# Patient Record
Sex: Female | Born: 1970 | Race: White | Hispanic: No | Marital: Married | Smoking: Never smoker
Health system: Southern US, Community
[De-identification: ages and names within clinical notes are randomized; demographics above are authoritative.]

## PROBLEM LIST (undated history)

## (undated) DIAGNOSIS — Z9889 Other specified postprocedural states: Secondary | ICD-10-CM

## (undated) DIAGNOSIS — T7840XA Allergy, unspecified, initial encounter: Secondary | ICD-10-CM

## (undated) DIAGNOSIS — R112 Nausea with vomiting, unspecified: Secondary | ICD-10-CM

## (undated) DIAGNOSIS — E785 Hyperlipidemia, unspecified: Secondary | ICD-10-CM

## (undated) HISTORY — DX: Allergy, unspecified, initial encounter: T78.40XA

## (undated) HISTORY — DX: Hyperlipidemia, unspecified: E78.5

## (undated) HISTORY — DX: Nausea with vomiting, unspecified: Z98.890

## (undated) HISTORY — DX: Other specified postprocedural states: Z98.890

## (undated) HISTORY — DX: Nausea with vomiting, unspecified: R11.2

## (undated) HISTORY — PX: ANKLE SURGERY: SHX546

---

## 1997-12-09 ENCOUNTER — Inpatient Hospital Stay (HOSPITAL_COMMUNITY): Admission: AD | Admit: 1997-12-09 | Discharge: 1997-12-10 | Payer: Self-pay | Admitting: Obstetrics and Gynecology

## 1998-04-20 ENCOUNTER — Inpatient Hospital Stay (HOSPITAL_COMMUNITY): Admission: AD | Admit: 1998-04-20 | Discharge: 1998-04-20 | Payer: Self-pay | Admitting: Obstetrics and Gynecology

## 1998-04-26 ENCOUNTER — Inpatient Hospital Stay (HOSPITAL_COMMUNITY): Admission: AD | Admit: 1998-04-26 | Discharge: 1998-04-29 | Payer: Self-pay | Admitting: Obstetrics and Gynecology

## 1998-04-26 ENCOUNTER — Inpatient Hospital Stay (HOSPITAL_COMMUNITY): Admission: AD | Admit: 1998-04-26 | Discharge: 1998-04-26 | Payer: Self-pay | Admitting: Obstetrics & Gynecology

## 1998-06-06 ENCOUNTER — Other Ambulatory Visit: Admission: RE | Admit: 1998-06-06 | Discharge: 1998-06-06 | Payer: Self-pay | Admitting: *Deleted

## 1999-07-01 ENCOUNTER — Other Ambulatory Visit: Admission: RE | Admit: 1999-07-01 | Discharge: 1999-07-01 | Payer: Self-pay | Admitting: *Deleted

## 2003-01-04 ENCOUNTER — Other Ambulatory Visit: Admission: RE | Admit: 2003-01-04 | Discharge: 2003-01-04 | Payer: Self-pay | Admitting: *Deleted

## 2004-11-20 ENCOUNTER — Other Ambulatory Visit: Admission: RE | Admit: 2004-11-20 | Discharge: 2004-11-20 | Payer: Self-pay | Admitting: Obstetrics and Gynecology

## 2006-01-18 ENCOUNTER — Other Ambulatory Visit: Admission: RE | Admit: 2006-01-18 | Discharge: 2006-01-18 | Payer: Self-pay | Admitting: Obstetrics and Gynecology

## 2006-02-05 ENCOUNTER — Ambulatory Visit: Payer: Self-pay | Admitting: Cardiology

## 2006-02-08 ENCOUNTER — Ambulatory Visit: Payer: Self-pay | Admitting: Cardiology

## 2006-02-25 ENCOUNTER — Ambulatory Visit: Payer: Self-pay

## 2006-02-25 ENCOUNTER — Encounter: Payer: Self-pay | Admitting: Cardiology

## 2006-03-23 ENCOUNTER — Ambulatory Visit: Payer: Self-pay | Admitting: Cardiology

## 2006-11-11 ENCOUNTER — Ambulatory Visit: Payer: Self-pay

## 2006-11-11 LAB — CONVERTED CEMR LAB: Total CK: 43 units/L (ref 7–177)

## 2008-02-17 ENCOUNTER — Emergency Department (HOSPITAL_COMMUNITY): Admission: EM | Admit: 2008-02-17 | Discharge: 2008-02-18 | Payer: Self-pay | Admitting: Emergency Medicine

## 2009-11-20 IMAGING — CT CT PELVIS W/ CM
2 of 8 series · 13 of 46 positions shown, 18 images · IV contrast (APPLIED)
Comparison: None.

CT ABDOMEN

CLINICAL DATA: Severe abdominal and pelvic pain.

CT ABDOMEN AND PELVIS WITH CONTRAST  02/18/2008:
TECHNIQUE: Multidetector CT imaging of the abdomen and pelvis was
performed using the standard protocol following bolus
administration of intravenous contrast. Because of findings
discussed below, repeat imaging through the pelvis was performed
approximately 2 hours after the initial imaging.
Contrast: 125 ml Tmnipaque-5KK IV.  Oral contrast also given.

[Series 2: abd_pel 5.0 b40f st · axial · 0.58mm/px · z∈[-460,-60]mm · 10 of 94 slices shown, 15 images]
[im 7/94  soft-tissue]
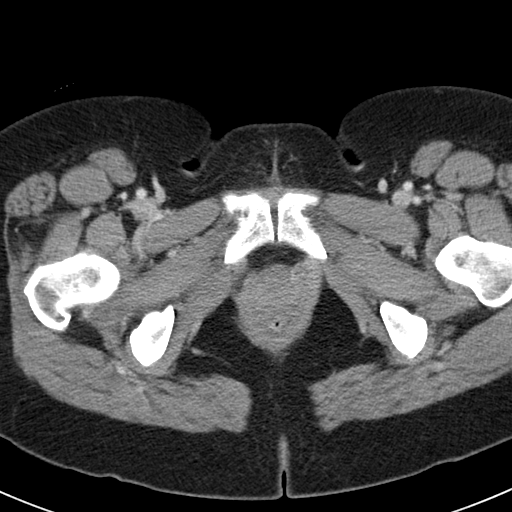
[im 7/94  bone]
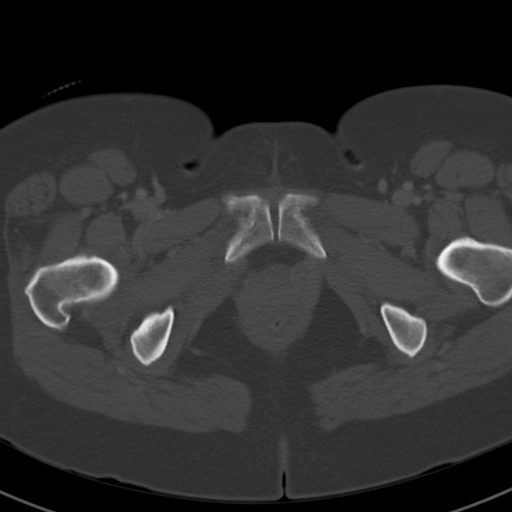
[im 19/94  soft-tissue]
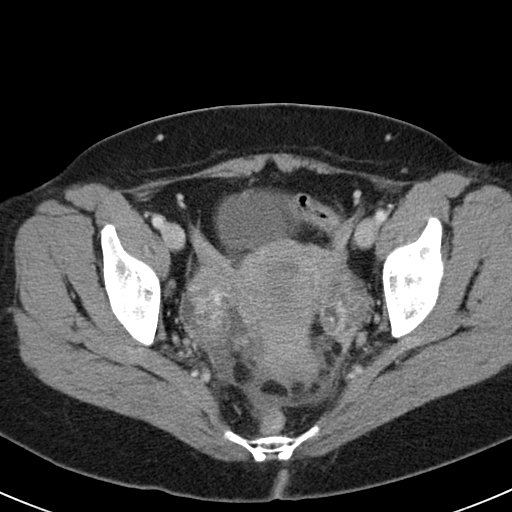
[im 25/94  soft-tissue]
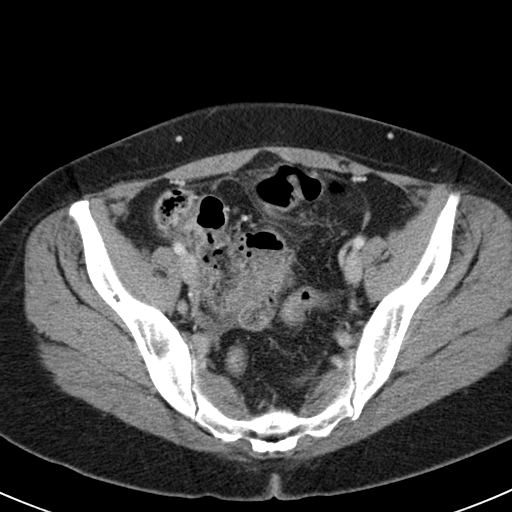
[im 38/94  soft-tissue]
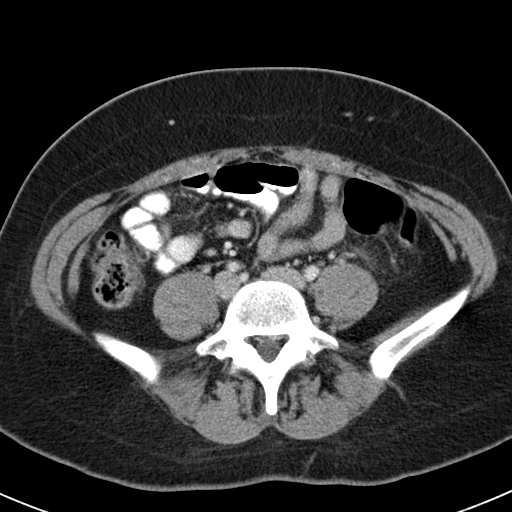
[im 50/94  soft-tissue]
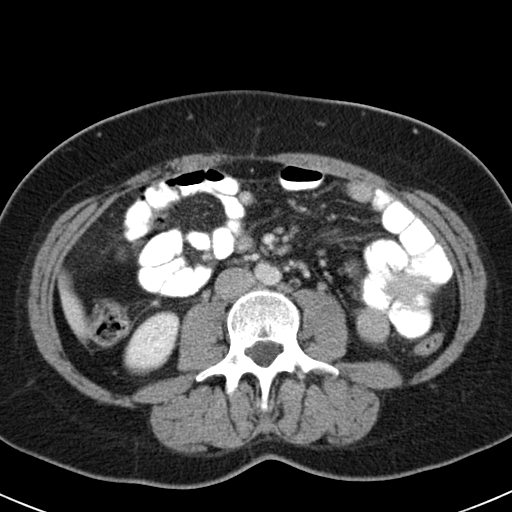
[im 56/94  soft-tissue]
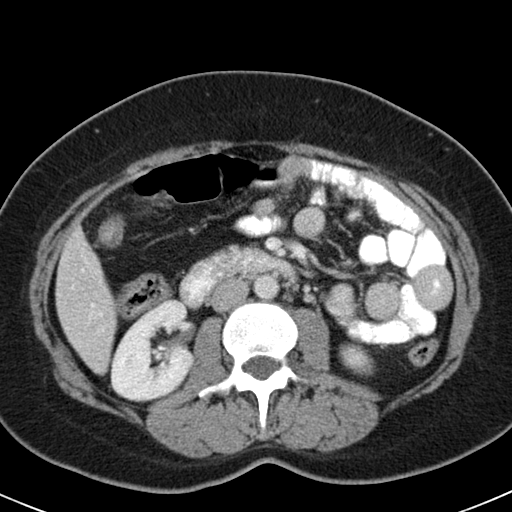
[im 69/94  soft-tissue]
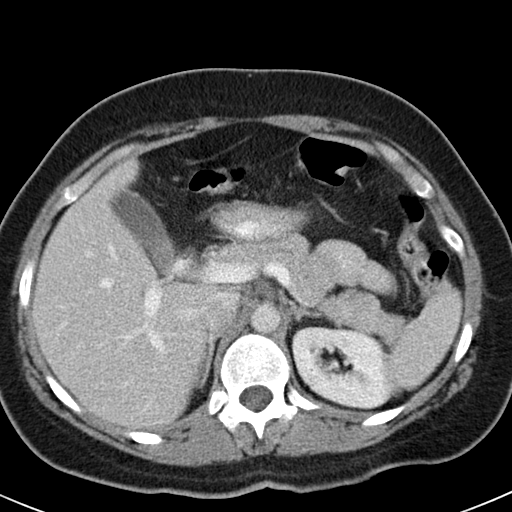
[im 69/94  lung]
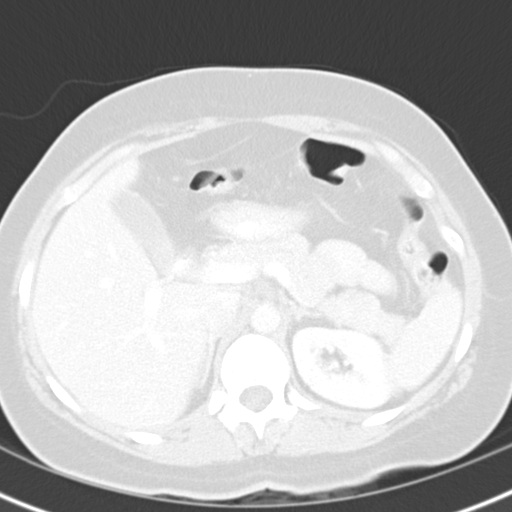
[im 75/94  soft-tissue]
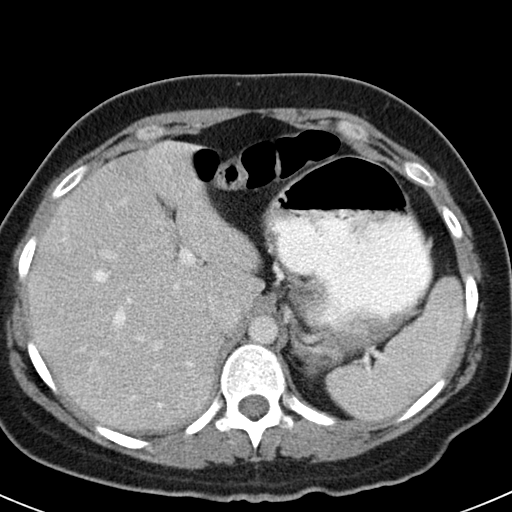
[im 75/94  lung]
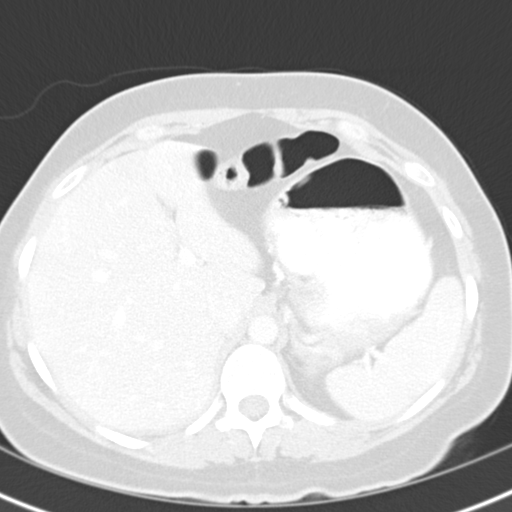
[im 81/94  lung]
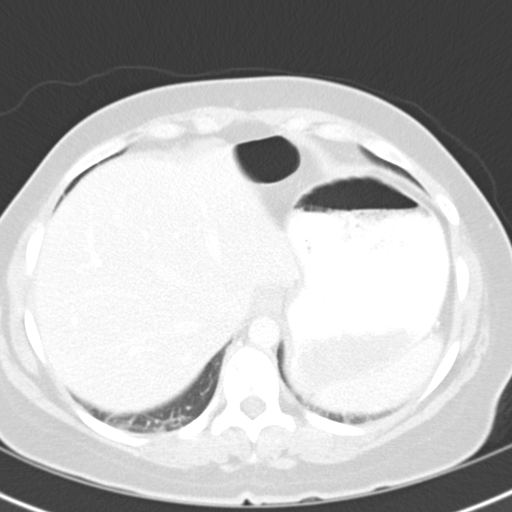
[im 87/94  soft-tissue]
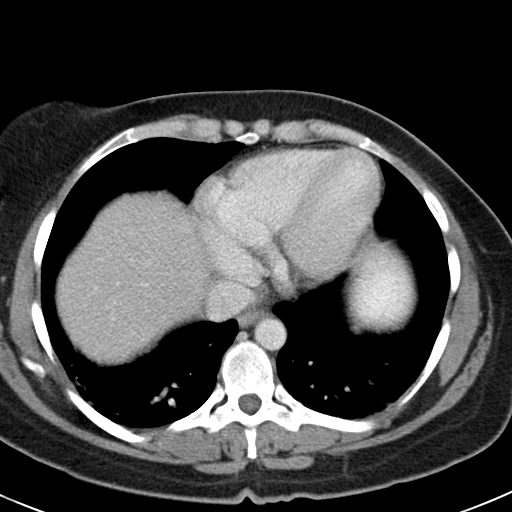
[im 87/94  lung]
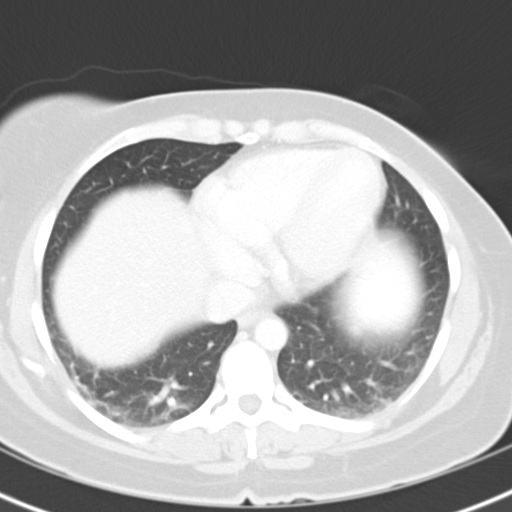
[im 87/94  bone]
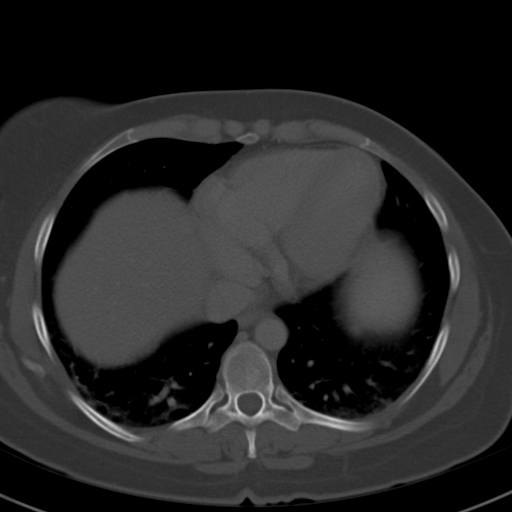

[Series 602: coronal abdomen · coronal · 0.94mm/px · 3 of 111 slices shown]
[im 28/111  soft-tissue]
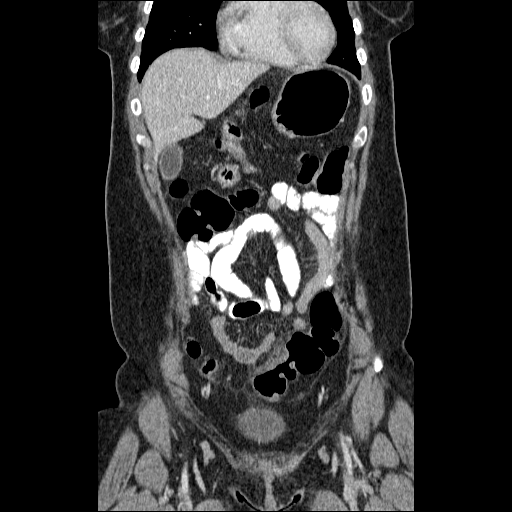
[im 56/111  soft-tissue]
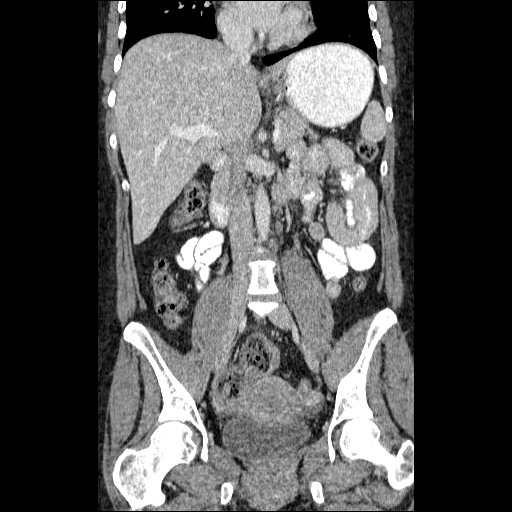
[im 83/111  soft-tissue]
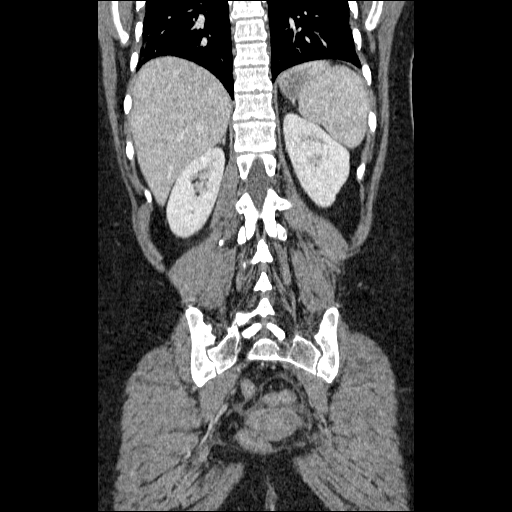

[13 of 46 positions shown; findings below may reference images not displayed]

FINDINGS: Normal appearing liver, spleen, pancreas, adrenal
glands, and kidneys.  Gallbladder unremarkable by CT.  No biliary
ductal dilation.  Stomach and visualized small bowel and colon
unremarkable in the upper abdomen.  Apparent wall thickening
involving a few loops of small bowel I believe is related to
peristalsis rather than true wall thickening. Abdominal aorta
normal in appearance.  Patent visceral arteries.  No significant
lymphadenopathy.  No ascites.  Visualized lung bases clear apart
from the expected dependent atelectasis in the lower lobes.  Bone
window images unremarkable.
IMPRESSION: 1.  Normal CT of the abdomen.

CT PELVIS
FINDINGS: Mild wall thickening involving the sigmoid colon, with
pericolonic inflammation surrounding this segment.  Delayed images
confirm a normal appearance to the cecum which extends into the low
pelvis on top of the bladder..  Normal appearing short appendix
present in the midline of the lower pelvis. Urinary bladder
unremarkable.  Uterus unremarkable by CT.  Numerous bilateral
ovarian cysts, some with enhancing walls consistent with functional
cysts; no dominant ovarian cyst.  Small amount of free fluid
dependently in the pelvis.  No abscess.  No significant
lymphadenopathy.  Bone window images unremarkable.
IMPRESSION: 1.  Sigmoid diverticulitis versus focal sigmoid colitis.
2.  Small amount of pelvic ascites.
3.  Bilateral ovarian cysts.

## 2011-07-21 LAB — CBC
MCHC: 34.6
MCV: 87.8
Platelets: 320
RDW: 13.1

## 2011-07-21 LAB — COMPREHENSIVE METABOLIC PANEL
AST: 19
Albumin: 3.7
Calcium: 8.7
Creatinine, Ser: 1.26 — ABNORMAL HIGH
GFR calc Af Amer: 58 — ABNORMAL LOW
GFR calc non Af Amer: 48 — ABNORMAL LOW
Total Protein: 7

## 2011-07-21 LAB — DIFFERENTIAL
Eosinophils Relative: 1
Lymphocytes Relative: 24
Lymphs Abs: 2.9
Monocytes Absolute: 1.3 — ABNORMAL HIGH
Monocytes Relative: 10

## 2011-07-21 LAB — PREGNANCY, URINE: Preg Test, Ur: NEGATIVE

## 2011-07-21 LAB — URINALYSIS, ROUTINE W REFLEX MICROSCOPIC
Bilirubin Urine: NEGATIVE
Hgb urine dipstick: NEGATIVE
Protein, ur: NEGATIVE
Urobilinogen, UA: 0.2

## 2011-07-21 LAB — POCT CARDIAC MARKERS
Operator id: 4531
Troponin i, poc: <0.05

## 2012-06-21 ENCOUNTER — Ambulatory Visit
Admission: RE | Admit: 2012-06-21 | Discharge: 2012-06-21 | Disposition: A | Discharge status: Home / Self Care | Payer: BC Managed Care – PPO | Source: Ambulatory Visit | Attending: Family Medicine | Admitting: Family Medicine

## 2012-06-21 ENCOUNTER — Other Ambulatory Visit: Payer: Self-pay | Admitting: Family Medicine

## 2012-06-21 DIAGNOSIS — Z0000 Encounter for general adult medical examination without abnormal findings: Secondary | ICD-10-CM

## 2014-03-24 IMAGING — CR DG CHEST 2V
2 series · 2 of 2 positions shown · non-contrast
Comparison: None

CLINICAL DATA: Routine physical examination.

CHEST - 2 VIEW

[view not recorded (1 of 2)]
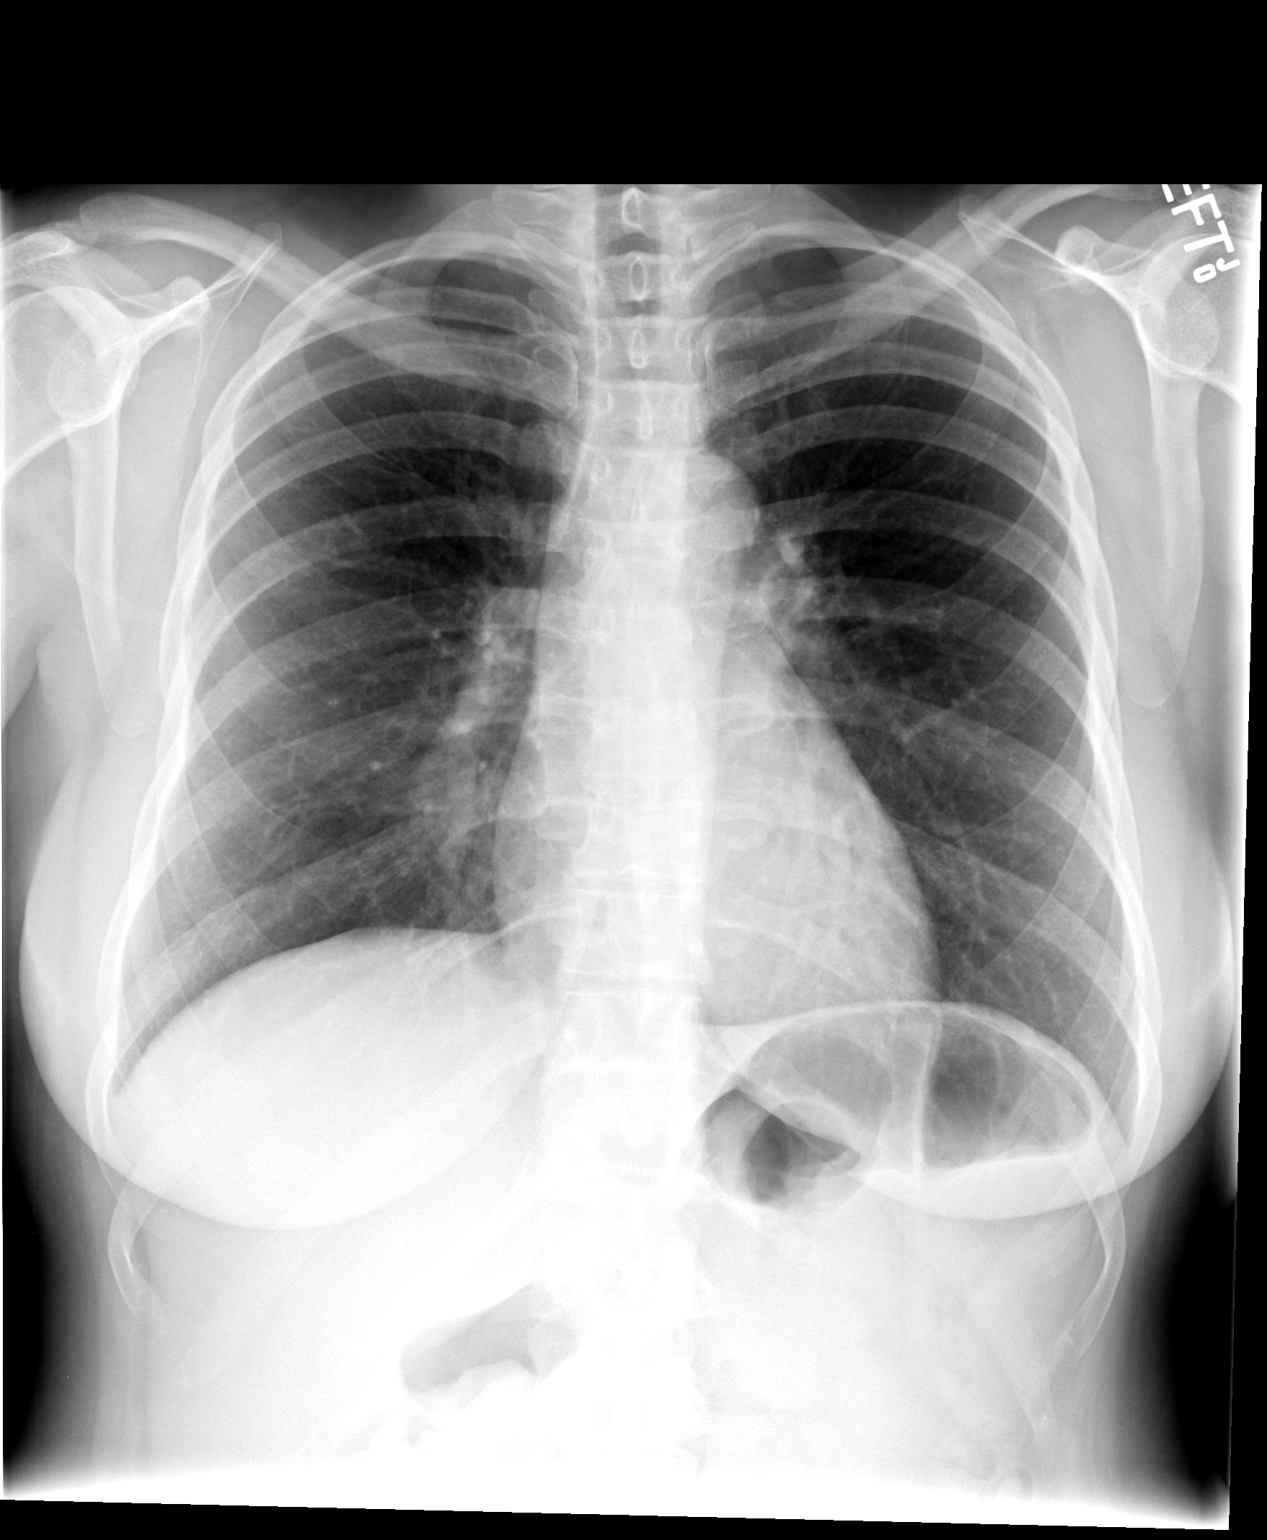

[view not recorded (2 of 2)]
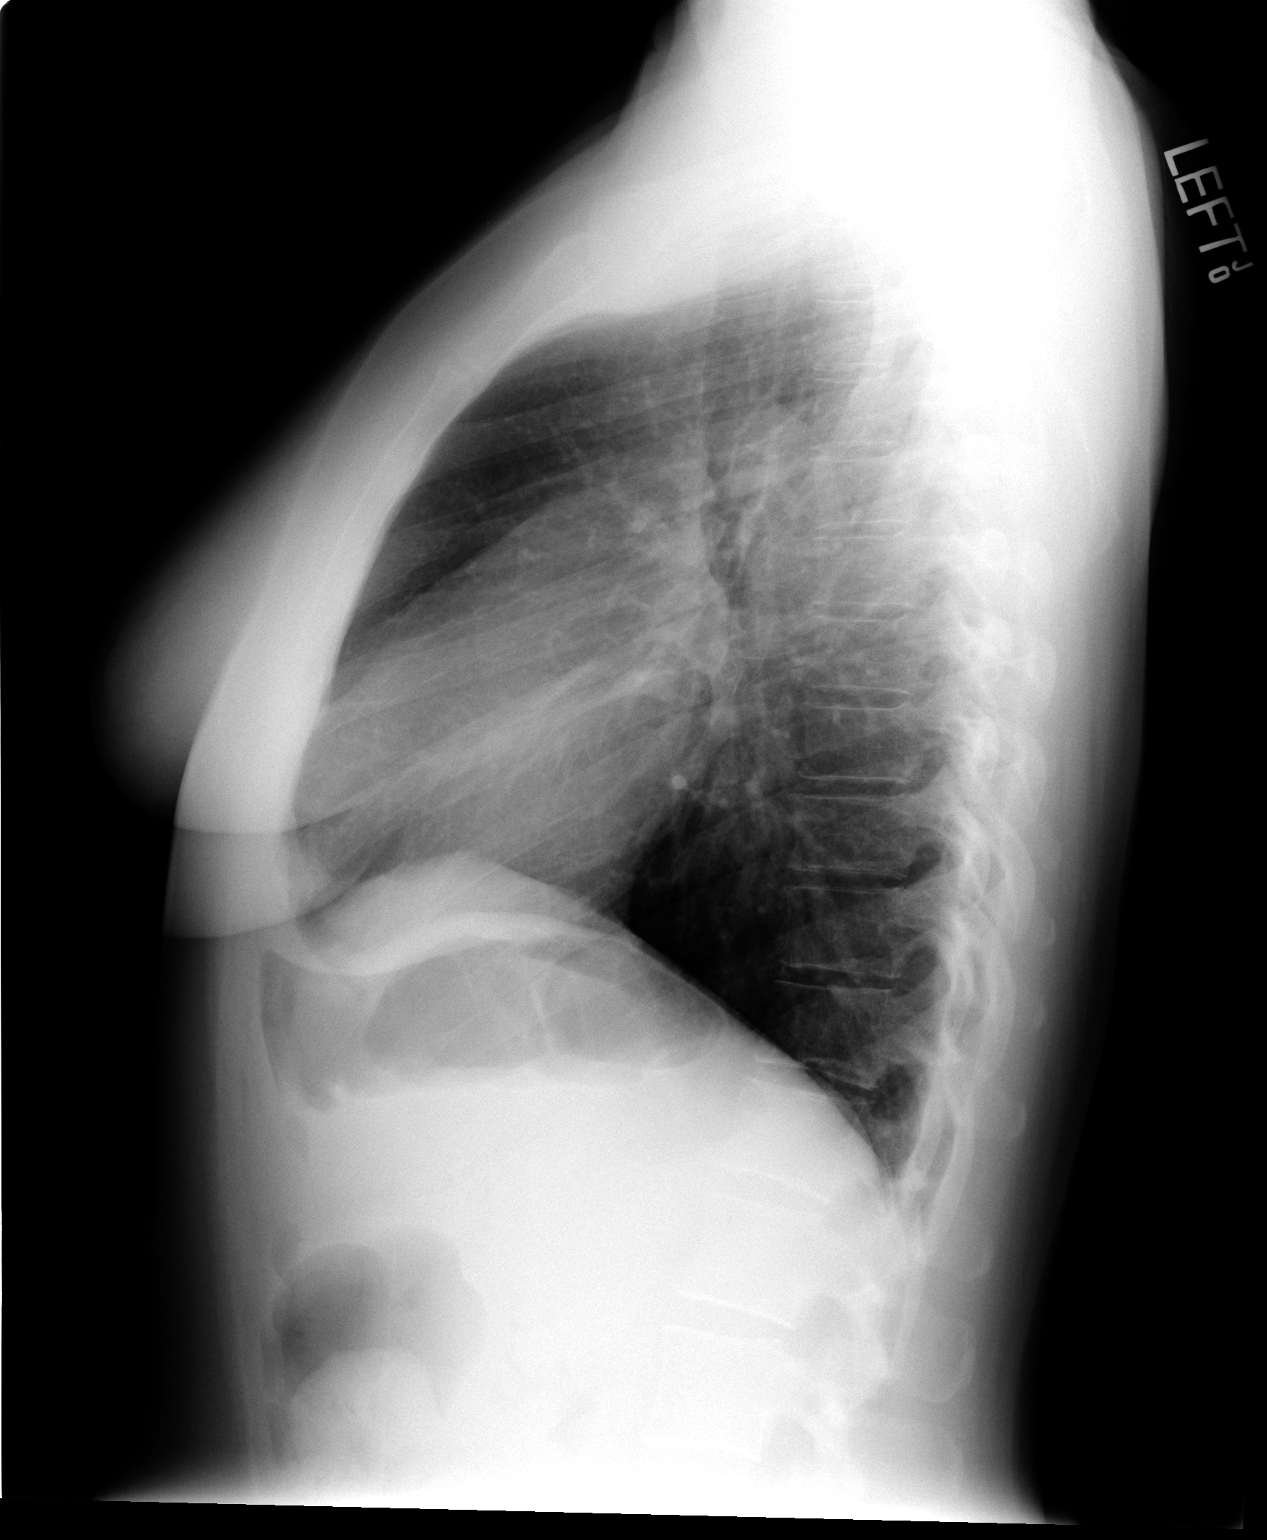

[2 of 2 positions shown; findings below may reference images not displayed]

FINDINGS: The cardiac silhouette, mediastinal and hilar contours
are within normal limits.  The lungs are clear.  No pleural
effusion.  The bony thorax is intact.
IMPRESSION: No acute cardiopulmonary findings.

## 2019-01-20 NOTE — Pre-Procedure Instructions (Addendum)
Shari Dalton  01/20/2019      [id number] - [place], [place] - [address] [place] [address] Phone: [phone] Fax: [phone]    Your procedure is scheduled on 01/30/19 Monday from 0730-0923am  Report to [facility], Entrance A at 0530 A.M.  Call this number if you have problems the morning of surgery:  [phone]   Remember:  Do not eat or drink after midnight.     Take these medicines the morning of surgery with A SIP OF WATER :none   As of today, STOP taking any Aspirin (unless otherwise instructed by your surgeon), Aleve, Naproxen, Ibuprofen, Motrin, Advil, Goody's, BC's, all herbal medications, fish oil, and all vitamins.   Do not wear jewelry, make-up or nail polish.  Do not wear lotions, powders, or perfumes, or deodorant.  Do not shave 48 hours prior to surgery.  Men may shave face and neck.  Do not bring valuables to the hospital.  [facility] is not responsible for any belongings or valuables.  Contacts, dentures or bridgework may not be worn into surgery.  Leave your suitcase in the car.  After surgery it may be brought to your room.  For patients admitted to the hospital, discharge time will be determined by your treatment team.  Patients discharged the day of surgery will not be allowed to drive home.   Special instructions:  [place]- Preparing For Surgery  Before surgery, you can play an important role. Because skin is not sterile, your skin needs to be as free of germs as possible. You can reduce the number of germs on your skin by washing with CHG (chlorahexidine gluconate) Soap before surgery.  CHG is an antiseptic cleaner which kills germs and bonds with the skin to continue killing germs even after washing.    Oral Hygiene is also important to reduce your risk of infection.  Remember - BRUSH YOUR TEETH THE MORNING OF SURGERY WITH YOUR REGULAR  TOOTHPASTE  Please do not use if you have an allergy to CHG or antibacterial soaps. If your skin becomes reddened/irritated stop using the CHG.  Do not shave (including legs and underarms) for at least 48 hours prior to first CHG shower. It is OK to shave your face.  Please follow these instructions carefully.   1. Shower the NIGHT BEFORE SURGERY and the MORNING OF SURGERY with CHG.   2. If you chose to wash your hair, wash your hair first as usual with your normal shampoo.  3. After you shampoo, rinse your hair and body thoroughly to remove the shampoo.  4. Use CHG as you would any other liquid soap. You can apply CHG directly to the skin and wash gently with a scrungie or a clean washcloth.   5. Apply the CHG Soap to your body ONLY FROM THE NECK DOWN.  Do not use on open wounds or open sores. Avoid contact with your eyes, ears, mouth and genitals (private parts). Wash Face and genitals (private parts)  with your normal soap.  6. Wash thoroughly, paying special attention to the area where your surgery will be performed.  7. Thoroughly rinse your body with warm water from the neck down.  8. DO NOT shower/wash with your normal soap after using and rinsing off the CHG Soap.  9. Pat yourself dry with a CLEAN TOWEL.  10. Wear CLEAN PAJAMAS to bed the night before surgery, wear comfortable  clothes the morning of surgery  11. Place CLEAN SHEETS on your bed the night of your first shower and DO NOT SLEEP WITH PETS.  Day of Surgery:  Do not apply any deodorants/lotions.  Please wear clean clothes to the hospital/surgery center.   Remember to brush your teeth WITH YOUR REGULAR TOOTHPASTE.  Please read over the following fact sheets that you were given. Pain Booklet, Coughing and Deep Breathing and Surgical Site Infection Prevention

## 2019-01-23 ENCOUNTER — Encounter (HOSPITAL_COMMUNITY): Payer: Self-pay

## 2019-01-23 ENCOUNTER — Encounter (HOSPITAL_COMMUNITY)
Admission: RE | Admit: 2019-01-23 | Discharge: 2019-01-23 | Disposition: A | Discharge status: Home / Self Care | Payer: Commercial Managed Care - PPO | Source: Ambulatory Visit | Attending: Obstetrics and Gynecology | Admitting: Obstetrics and Gynecology

## 2019-01-23 ENCOUNTER — Other Ambulatory Visit: Payer: Self-pay

## 2019-01-23 DIAGNOSIS — Z01812 Encounter for preprocedural laboratory examination: Secondary | ICD-10-CM | POA: Insufficient documentation

## 2019-01-23 LAB — CBC
HEMATOCRIT: 42.7 % (ref 36.0–46.0)
HEMOGLOBIN: 14 g/dL (ref 12.0–15.0)
MCH: 30 pg (ref 26.0–34.0)
MCHC: 32.8 g/dL (ref 30.0–36.0)
MCV: 91.6 fL (ref 80.0–100.0)
Platelets: 293 10*3/uL (ref 150–400)
RBC: 4.66 MIL/uL (ref 3.87–5.11)
RDW: 13 % (ref 11.5–15.5)
WBC: 5.3 10*3/uL (ref 4.0–10.5)
nRBC: 0 % (ref 0.0–0.2)

## 2019-01-23 NOTE — Progress Notes (Signed)
PCP - Dr. Dian Queen Cardiologist - has seen [facility] 10+ years ago for high cholesterol. No cardiac issues  Chest x-ray - N/A EKG - N/A Stress Test - denies ECHO - denies Cardiac Cath - denies  Sleep Study - denies  Aspirin Instructions: Patient instructed to hold all Aspirin, NSAID's, herbal medications, fish oil and vitamins 7 days prior to surgery.   Anesthesia review:   Patient denies shortness of breath, fever, cough and chest pain at PAT appointment   Patient verbalized understanding of instructions that were given to them at the PAT appointment. Patient was also instructed that they will need to review over the PAT instructions again at home before surgery.

## 2019-01-25 NOTE — H&P (Signed)
  48 year old G 3 P 2 with heavy, irregular bleeding despite IUD and Craumping. Ultrasound enlarged myomatous uterus about 12 week  PMH Unremarkable  PSH  None  Prior to Admission medications   Medication Sig Start Date End Date Taking? Authorizing Provider  ibuprofen (ADVIL,MOTRIN) 200 MG tablet Take 400-600 mg by mouth every 8 (eight) hours as needed (pain.).   Yes [provider]   Latex and Neosporin [neomycin-bacitracin zn-polymyx]  No family history on file.  General alert and oriented Lung CTAB Car RRR Abdomen soft and non tender Pelvic Broad based enlarged myomatous uterus   IMPRESSION Symptomatic Fibroids Menorrhagia  PLAN: TAH BS Risks reviewed Consent signed

## 2019-01-27 MED ORDER — CEFOTETAN DISODIUM-DEXTROSE 2-2.08 GM-%(50ML) IV SOLR
2.0000 g | INTRAVENOUS | Status: AC
  Administered 2019-01-30: 2 g via INTRAVENOUS
  Filled 2019-01-27: qty 50

## 2019-01-29 NOTE — Anesthesia Preprocedure Evaluation (Addendum)
Anesthesia Evaluation  Patient identified by MRN, date of birth, ID band Patient awake    Reviewed: Allergy & Precautions, NPO status , Patient's Chart, lab work & pertinent test results  Airway Mallampati: II  TM Distance: >3 FB Neck ROM: Full    Dental no notable dental hx. (+) Teeth Intact, Dental Advisory Given   Pulmonary neg pulmonary ROS,    Pulmonary exam normal breath sounds clear to auscultation       Cardiovascular Exercise Tolerance: Good negative cardio ROS Normal cardiovascular exam Rhythm:Regular Rate:Normal     Neuro/Psych negative neurological ROS  negative psych ROS   GI/Hepatic negative GI ROS, Neg liver ROS,   Endo/Other  negative endocrine ROS  Renal/GU negative Renal ROS     Musculoskeletal negative musculoskeletal ROS (+)   Abdominal   Peds  Hematology HGB 14.0 plts 273   Anesthesia Other Findings   Reproductive/Obstetrics                            Anesthesia Physical Anesthesia Plan  ASA: II  Anesthesia Plan: General   Post-op Pain Management:    Induction: Intravenous  PONV Risk Score and Plan: 4 or greater and Treatment may vary due to age or medical condition, Ondansetron, Dexamethasone, Scopolamine patch - Pre-op and Midazolam  Airway Management Planned: Oral ETT  Additional Equipment:   Intra-op Plan:   Post-operative Plan: Extubation in OR  Informed Consent: I have reviewed the patients History and Physical, chart, labs and discussed the procedure including the risks, benefits and alternatives for the proposed anesthesia with the patient or authorized representative who has indicated his/her understanding and acceptance.     Dental advisory given  Plan Discussed with: CRNA  Anesthesia Plan Comments:        Anesthesia Quick Evaluation

## 2019-01-30 ENCOUNTER — Encounter (HOSPITAL_COMMUNITY)
Admission: AD | Disposition: A | Discharge status: Home / Self Care | Payer: Self-pay | Source: Home / Self Care | Attending: Obstetrics and Gynecology

## 2019-01-30 ENCOUNTER — Inpatient Hospital Stay (HOSPITAL_COMMUNITY): Payer: Commercial Managed Care - PPO | Admitting: Certified Registered Nurse Anesthetist

## 2019-01-30 ENCOUNTER — Encounter (HOSPITAL_COMMUNITY): Payer: Self-pay

## 2019-01-30 ENCOUNTER — Other Ambulatory Visit: Payer: Self-pay

## 2019-01-30 ENCOUNTER — Inpatient Hospital Stay (HOSPITAL_COMMUNITY)
Admission: AD | Admit: 2019-01-30 | Discharge: 2019-01-31 | DRG: 743 | Disposition: A | Discharge status: Home / Self Care | Payer: Commercial Managed Care - PPO | Attending: Obstetrics and Gynecology | Admitting: Obstetrics and Gynecology

## 2019-01-30 DIAGNOSIS — D259 Leiomyoma of uterus, unspecified: Principal | ICD-10-CM | POA: Diagnosis present

## 2019-01-30 DIAGNOSIS — N920 Excessive and frequent menstruation with regular cycle: Secondary | ICD-10-CM | POA: Diagnosis present

## 2019-01-30 DIAGNOSIS — D219 Benign neoplasm of connective and other soft tissue, unspecified: Secondary | ICD-10-CM | POA: Diagnosis present

## 2019-01-30 DIAGNOSIS — Z90710 Acquired absence of both cervix and uterus: Secondary | ICD-10-CM | POA: Diagnosis present

## 2019-01-30 HISTORY — PX: HYSTERECTOMY ABDOMINAL WITH SALPINGECTOMY: SHX6725

## 2019-01-30 LAB — TYPE AND SCREEN
ABO/RH(D): A POS
Antibody Screen: NEGATIVE

## 2019-01-30 LAB — ABO/RH: ABO/RH(D): A POS

## 2019-01-30 LAB — POCT PREGNANCY, URINE: Preg Test, Ur: NEGATIVE

## 2019-01-30 SURGERY — HYSTERECTOMY, TOTAL, ABDOMINAL, WITH SALPINGECTOMY
Anesthesia: General | Site: Abdomen | Laterality: Bilateral

## 2019-01-30 MED ORDER — PROPOFOL 10 MG/ML IV BOLUS
INTRAVENOUS | Status: AC
  Filled 2019-01-30: qty 20

## 2019-01-30 MED ORDER — OXYCODONE HCL 5 MG/5ML PO SOLN: 5.0000 mg | Freq: Once | ORAL | Status: DC | PRN

## 2019-01-30 MED ORDER — TRAMADOL HCL 50 MG PO TABS
50.0000 mg | ORAL_TABLET | Freq: Four times a day (QID) | ORAL | Status: DC | PRN
  Administered 2019-01-30: 10:00:00 50 mg via ORAL
  Filled 2019-01-30: qty 1

## 2019-01-30 MED ORDER — SUCCINYLCHOLINE CHLORIDE 200 MG/10ML IV SOSY
PREFILLED_SYRINGE | INTRAVENOUS | Status: AC
  Filled 2019-01-30: qty 10

## 2019-01-30 MED ORDER — KETOROLAC TROMETHAMINE 30 MG/ML IJ SOLN: 30.0000 mg | Freq: Once | INTRAMUSCULAR | Status: DC

## 2019-01-30 MED ORDER — MIDAZOLAM HCL 2 MG/2ML IJ SOLN
INTRAMUSCULAR | Status: AC
  Filled 2019-01-30: qty 2

## 2019-01-30 MED ORDER — LIDOCAINE HCL (CARDIAC) PF 100 MG/5ML IV SOSY
PREFILLED_SYRINGE | INTRAVENOUS | Status: DC | PRN
  Administered 2019-01-30: 100 mg via INTRATRACHEAL

## 2019-01-30 MED ORDER — KETAMINE HCL 10 MG/ML IJ SOLN
INTRAMUSCULAR | Status: DC | PRN
  Administered 2019-01-30 (×3): 10 mg via INTRAVENOUS

## 2019-01-30 MED ORDER — EPHEDRINE SULFATE 50 MG/ML IJ SOLN
INTRAMUSCULAR | Status: DC | PRN
  Administered 2019-01-30: 15 mg via INTRAVENOUS
  Administered 2019-01-30 (×3): 5 mg via INTRAVENOUS

## 2019-01-30 MED ORDER — KETOROLAC TROMETHAMINE 30 MG/ML IJ SOLN: 30.0000 mg | Freq: Once | INTRAMUSCULAR | Status: DC | PRN

## 2019-01-30 MED ORDER — MENTHOL 3 MG MT LOZG: 1.0000 | LOZENGE | OROMUCOSAL | Status: DC | PRN

## 2019-01-30 MED ORDER — LACTATED RINGERS IV SOLN
INTRAVENOUS | Status: DC
  Administered 2019-01-30 (×2): via INTRAVENOUS

## 2019-01-30 MED ORDER — LIDOCAINE IN D5W 4-5 MG/ML-% IV SOLN
1.0000 mg/min | INTRAVENOUS | Status: AC
  Administered 2019-01-30: 25 ug/kg/min via INTRAVENOUS
  Filled 2019-01-30: qty 500

## 2019-01-30 MED ORDER — LACTATED RINGERS IV SOLN
INTRAVENOUS | Status: DC
  Administered 2019-01-30: 14:00:00 via INTRAVENOUS

## 2019-01-30 MED ORDER — ACETAMINOPHEN 500 MG PO TABS
1000.0000 mg | ORAL_TABLET | Freq: Once | ORAL | Status: AC
  Administered 2019-01-30: 1000 mg via ORAL
  Filled 2019-01-30: qty 2

## 2019-01-30 MED ORDER — HYDROMORPHONE HCL 1 MG/ML IJ SOLN: 0.2500 mg | INTRAMUSCULAR | Status: DC | PRN

## 2019-01-30 MED ORDER — GABAPENTIN 300 MG PO CAPS
300.0000 mg | ORAL_CAPSULE | Freq: Once | ORAL | Status: AC
  Administered 2019-01-30: 300 mg via ORAL
  Filled 2019-01-30: qty 1

## 2019-01-30 MED ORDER — ONDANSETRON HCL 4 MG/2ML IJ SOLN
INTRAMUSCULAR | Status: DC | PRN
  Administered 2019-01-30: 4 mg via INTRAVENOUS

## 2019-01-30 MED ORDER — OXYCODONE HCL 5 MG PO TABS
5.0000 mg | ORAL_TABLET | ORAL | Status: DC | PRN
  Administered 2019-01-30: 5 mg via ORAL
  Filled 2019-01-30: qty 1

## 2019-01-30 MED ORDER — SIMETHICONE 80 MG PO CHEW: 80.0000 mg | CHEWABLE_TABLET | Freq: Four times a day (QID) | ORAL | Status: DC | PRN

## 2019-01-30 MED ORDER — BUPIVACAINE HCL (PF) 0.25 % IJ SOLN
INTRAMUSCULAR | Status: DC | PRN
  Administered 2019-01-30: 10 mL

## 2019-01-30 MED ORDER — HYDROMORPHONE HCL 2 MG PO TABS: 2.0000 mg | ORAL_TABLET | ORAL | Status: DC | PRN

## 2019-01-30 MED ORDER — PROPOFOL 10 MG/ML IV BOLUS
INTRAVENOUS | Status: DC | PRN
  Administered 2019-01-30: 170 mg via INTRAVENOUS

## 2019-01-30 MED ORDER — BUPIVACAINE HCL (PF) 0.25 % IJ SOLN
INTRAMUSCULAR | Status: AC
  Filled 2019-01-30: qty 30

## 2019-01-30 MED ORDER — ROCURONIUM BROMIDE 10 MG/ML (PF) SYRINGE
PREFILLED_SYRINGE | INTRAVENOUS | Status: DC | PRN
  Administered 2019-01-30: 50 mg via INTRAVENOUS

## 2019-01-30 MED ORDER — IBUPROFEN 800 MG PO TABS
800.0000 mg | ORAL_TABLET | Freq: Three times a day (TID) | ORAL | Status: DC
  Administered 2019-01-30 – 2019-01-31 (×4): 800 mg via ORAL
  Filled 2019-01-30 (×4): qty 1

## 2019-01-30 MED ORDER — ONDANSETRON HCL 4 MG/2ML IJ SOLN
4.0000 mg | Freq: Three times a day (TID) | INTRAMUSCULAR | Status: DC | PRN
  Administered 2019-01-30 (×2): 4 mg via INTRAVENOUS
  Filled 2019-01-30 (×2): qty 2

## 2019-01-30 MED ORDER — FENTANYL CITRATE (PF) 250 MCG/5ML IJ SOLN
INTRAMUSCULAR | Status: AC
  Filled 2019-01-30: qty 5

## 2019-01-30 MED ORDER — KETAMINE HCL 50 MG/5ML IJ SOSY
PREFILLED_SYRINGE | INTRAMUSCULAR | Status: AC
  Filled 2019-01-30: qty 5

## 2019-01-30 MED ORDER — FENTANYL CITRATE (PF) 250 MCG/5ML IJ SOLN
INTRAMUSCULAR | Status: DC | PRN
  Administered 2019-01-30 (×2): 25 ug via INTRAVENOUS
  Administered 2019-01-30: 100 ug via INTRAVENOUS

## 2019-01-30 MED ORDER — SCOPOLAMINE 1 MG/3DAYS TD PT72
1.0000 | MEDICATED_PATCH | TRANSDERMAL | Status: DC
  Administered 2019-01-30: 06:00:00 1.5 mg via TRANSDERMAL
  Filled 2019-01-30: qty 1

## 2019-01-30 MED ORDER — HYDROMORPHONE HCL 1 MG/ML IJ SOLN
1.0000 mg | INTRAMUSCULAR | Status: DC | PRN
  Administered 2019-01-30 (×2): 1 mg via INTRAVENOUS
  Filled 2019-01-30 (×2): qty 1

## 2019-01-30 MED ORDER — SUCCINYLCHOLINE CHLORIDE 20 MG/ML IJ SOLN
INTRAMUSCULAR | Status: DC | PRN
  Administered 2019-01-30: 80 mg via INTRAVENOUS

## 2019-01-30 MED ORDER — MIDAZOLAM HCL 2 MG/2ML IJ SOLN
INTRAMUSCULAR | Status: DC | PRN
  Administered 2019-01-30: 2 mg via INTRAVENOUS

## 2019-01-30 MED ORDER — LIDOCAINE 2% (20 MG/ML) 5 ML SYRINGE
INTRAMUSCULAR | Status: AC
  Filled 2019-01-30: qty 5

## 2019-01-30 MED ORDER — OXYCODONE HCL 5 MG PO TABS: 5.0000 mg | ORAL_TABLET | Freq: Once | ORAL | Status: DC | PRN

## 2019-01-30 MED ORDER — DEXAMETHASONE SODIUM PHOSPHATE 10 MG/ML IJ SOLN
INTRAMUSCULAR | Status: DC | PRN
  Administered 2019-01-30: 4 mg via INTRAVENOUS

## 2019-01-30 MED ORDER — PROMETHAZINE HCL 25 MG/ML IJ SOLN: 12.5000 mg | Freq: Once | INTRAMUSCULAR | Status: DC | PRN

## 2019-01-30 SURGICAL SUPPLY — 41 items
ADH SKN CLS APL DERMABOND .7 (GAUZE/BANDAGES/DRESSINGS) ×1
BRR ADH 6X5 SEPRAFILM 1 SHT (MISCELLANEOUS)
CANISTER SUCT 3000ML PPV (MISCELLANEOUS) ×3 IMPLANT
COVER WAND RF STERILE (DRAPES) ×3 IMPLANT
DECANTER SPIKE VIAL GLASS SM (MISCELLANEOUS) IMPLANT
DERMABOND ADVANCED (GAUZE/BANDAGES/DRESSINGS) ×2
DERMABOND ADVANCED .7 DNX12 (GAUZE/BANDAGES/DRESSINGS) ×1 IMPLANT
DRAPE CESAREAN BIRTH W POUCH (DRAPES) ×3 IMPLANT
DRAPE WARM FLUID 44X44 (DRAPE) ×2 IMPLANT
DRSG OPSITE POSTOP 4X10 (GAUZE/BANDAGES/DRESSINGS) ×3 IMPLANT
DURAPREP 26ML APPLICATOR (WOUND CARE) ×3 IMPLANT
GAUZE 4X4 16PLY RFD (DISPOSABLE) ×4 IMPLANT
GLOVE BIO SURGEON STRL SZ 6.5 (GLOVE) ×2 IMPLANT
GLOVE BIO SURGEONS STRL SZ 6.5 (GLOVE) ×1
GLOVE BIOGEL PI IND STRL 7.0 (GLOVE) ×2 IMPLANT
GLOVE BIOGEL PI IND STRL 8 (GLOVE) ×1 IMPLANT
GLOVE BIOGEL PI INDICATOR 7.0 (GLOVE) ×4
GLOVE BIOGEL PI INDICATOR 8 (GLOVE) ×2
GOWN STRL REUS W/ TWL LRG LVL3 (GOWN DISPOSABLE) ×3 IMPLANT
GOWN STRL REUS W/TWL LRG LVL3 (GOWN DISPOSABLE) ×9
KIT TURNOVER KIT B (KITS) ×3 IMPLANT
NEEDLE HYPO 22GX1.5 SAFETY (NEEDLE) ×3 IMPLANT
NS IRRIG 1000ML POUR BTL (IV SOLUTION) ×7 IMPLANT
PACK ABDOMINAL GYN (CUSTOM PROCEDURE TRAY) ×3 IMPLANT
PAD ARMBOARD 7.5X6 YLW CONV (MISCELLANEOUS) ×3 IMPLANT
PAD OB MATERNITY 4.3X12.25 (PERSONAL CARE ITEMS) ×3 IMPLANT
SEPRAFILM MEMBRANE 5X6 (MISCELLANEOUS) IMPLANT
SPONGE LAP 18X18 RF (DISPOSABLE) IMPLANT
STAPLER VISISTAT 35W (STAPLE) IMPLANT
SUT PDS AB 0 CT 36 (SUTURE) IMPLANT
SUT PDS AB 0 CTX 60 (SUTURE) IMPLANT
SUT PLAIN 2 0 XLH (SUTURE) ×2 IMPLANT
SUT VIC AB 0 CT1 18XCR BRD8 (SUTURE) ×2 IMPLANT
SUT VIC AB 0 CT1 27 (SUTURE) ×12
SUT VIC AB 0 CT1 27XBRD ANBCTR (SUTURE) ×4 IMPLANT
SUT VIC AB 0 CT1 8-18 (SUTURE) ×9
SUT VIC AB 4-0 KS 27 (SUTURE) ×3 IMPLANT
SUT VICRYL 0 TIES 12 18 (SUTURE) ×3 IMPLANT
SYR CONTROL 10ML LL (SYRINGE) ×3 IMPLANT
TOWEL GREEN STERILE FF (TOWEL DISPOSABLE) ×6 IMPLANT
TRAY FOLEY W/BAG SLVR 14FR (SET/KITS/TRAYS/PACK) ×3 IMPLANT

## 2019-01-30 NOTE — Transfer of Care (Signed)
Immediate Anesthesia Transfer of Care Note  Patient: Shari Dalton  Procedure(s) Performed: HYSTERECTOMY ABDOMINAL WITH SALPINGECTOMY (Bilateral Abdomen)  Patient Location: PACU  Anesthesia Type:General  Level of Consciousness: awake, alert  and oriented  Airway & Oxygen Therapy: Patient Spontanous Breathing and Patient connected to face mask oxygen  Post-op Assessment: Report given to RN, Post -op Vital signs reviewed and stable and Patient moving all extremities  Post vital signs: Reviewed and stable  Last Vitals:  Vitals Value Taken Time  BP    Temp    Pulse    Resp    SpO2      Last Pain:  Vitals:   01/30/19 0859  TempSrc:   PainSc: 4          Complications: No apparent anesthesia complications

## 2019-01-30 NOTE — Brief Op Note (Signed)
01/30/2019  8:50 AM  PATIENT:  Shari Dalton  48 y.o. female  PRE-OPERATIVE DIAGNOSIS:   Symptomatic Fibroids  POST-OPERATIVE DIAGNOSIS: Same   PROCEDURE:  Procedure(s) with comments: HYSTERECTOMY ABDOMINAL WITH SALPINGECTOMY (Bilateral) - need bed  SURGEON:  Surgeon(s) and Role:    Dian Queen, MD - Primary Dr. Louretta Shorten  PHYSICIAN ASSISTANT:   ASSISTANTS: none   ANESTHESIA:   general  EBL:  100 mL   BLOOD ADMINISTERED:none  DRAINS: Urinary Catheter (Foley)   LOCAL MEDICATIONS USED:  LIDOCAINE   SPECIMEN:  Source of Specimen:  uterus, cervix and tubes  DISPOSITION OF SPECIMEN:  PATHOLOGY  COUNTS:  YES  TOURNIQUET:  * No tourniquets in log *  DICTATION: .Other Dictation: Dictation Number dictated  PLAN OF CARE: Admit to inpatient   PATIENT DISPOSITION:  PACU - hemodynamically stable.   Delay start of Pharmacological VTE agent (>24hrs) due to surgical blood loss or risk of bleeding: not applicable

## 2019-01-30 NOTE — Op Note (Signed)
NAME: Shari Dalton, Shari Dalton MEDICAL RECORD [id number] ACCOUNT [id number] DATE OF BIRTH:06-12-1971 FACILITY: [facility] LOCATION: [facility] PHYSICIAN:Annitta Fifield Lynett Fish, MD  OPERATIVE REPORT  DATE OF PROCEDURE:  01/30/2019  PREOPERATIVE DIAGNOSIS:  Symptomatic fibroids, severe pelvic pain, menorrhagia.  POSTOPERATIVE DIAGNOSIS:  Symptomatic fibroids, severe pelvic pain, menorrhagia.  PROCEDURE:  Total abdominal hysterectomy and bilateral salpingectomy.  SURGEON:  Dian Queen, MD  ASSISTANT:  Louretta Shorten, MD  ANESTHESIA:  General.  ESTIMATED BLOOD LOSS:  100 mL.  COMPLICATIONS:  None.  DRAINS:  Foley catheter.  DESCRIPTION OF PROCEDURE:  The patient was taken to the operating room.  She was intubated without difficulty.  She was then prepped and draped in the usual sterile fashion and a Foley catheter was inserted.  A low transverse incision was made and  carried down to the fascia and the fascia was scored in the midline.  The peritoneum was entered bluntly and the peritoneal incision was then stretched.  The self-retaining retractor was placed in the abdominal cavity.  The uterus was noted to be  myomatous and enlarged.  The adnexa were unremarkable.  We then placed Kelly clamps across the triple pedicle on either side.  Each pedicle was clamped, cut and suture ligated using 0 Vicryl suture.  We developed the bladder flap in normal fashion and  the uterine arteries were skeletonized in standard fashion and the uterine arteries were clamped on either side at the level of the internal os using 0 Vicryl suture.  We then walked our way down, staying snug beside the cervix, clamping the uterosacral,  broad and the cardinal ligaments.  Each pedicle was clamped, cut and suture ligated using 0 Vicryl suture.  Once we reached the level of the external os, curved Heaney clamps were placed across that and the specimen was removed and identified as cervix  and uterus.  The angle sutures were placed in  standard fashion using 0 Vicryl suture.  The remainder of the cuff was closed using 0 Vicryl in figure-of-eight suture.  We then went back up to the adnexa and then placed curved Heaney clamps across each  mesosalpinx and removed each section of fallopian tube and each pedicle was secured using 0 Vicryl suture.  We then transfixed each ovary to the round ligament on each side.  Irrigation was performed.  Hemostasis was very good.  All instruments and  laparotomy pads were removed from the abdominal cavity.  The peritoneum was closed using 0 Vicryl.  The fascia was closed using 0 Vicryl in a running stitch starting in each corner meeting in the midline.  The subcutaneous was closed with 0 plain gut  interrupteds.  The skin was closed with 3-0 Vicryl on a Keith needle.  All sponge, lap and instrument counts were correct x2.  The patient went to recovery room in stable condition.  TN/NUANCE  D:01/30/2019 T:01/30/2019 JOB:[id number]

## 2019-01-30 NOTE — Anesthesia Postprocedure Evaluation (Signed)
Anesthesia Post Note  Patient: Shari Dalton  Procedure(s) Performed: HYSTERECTOMY ABDOMINAL WITH SALPINGECTOMY (Bilateral Abdomen)     Patient location during evaluation: PACU Anesthesia Type: General Level of consciousness: awake and alert Pain management: pain level controlled Vital Signs Assessment: post-procedure vital signs reviewed and stable Respiratory status: spontaneous breathing, nonlabored ventilation, respiratory function stable and patient connected to nasal cannula oxygen Cardiovascular status: blood pressure returned to baseline and stable Postop Assessment: no apparent nausea or vomiting Anesthetic complications: no    Last Vitals:  Vitals:   01/30/19 1059 01/30/19 1233  BP: (!) 97/53 110/65  Pulse: 65 77  Resp:    Temp: 36.7 C   SpO2: 100%     Last Pain:  Vitals:   01/30/19 1236  TempSrc:   PainSc: 10-Worst pain ever                 Barnet Glasgow

## 2019-01-30 NOTE — Progress Notes (Signed)
H and P on the chart No changes Will proceed with TAH, BS Consent signed

## 2019-01-30 NOTE — Progress Notes (Signed)
Received patient from PACU. Patient drowsy but easily arousable. Abdominal honeycomb dressing small drainage marked, intact. Patient oriented to call bell. Will continue to monitor.

## 2019-01-30 NOTE — Anesthesia Procedure Notes (Addendum)
Procedure Name: Intubation Date/Time: 01/30/2019 7:37 AM Performed by: Leonor Liv, CRNA Pre-anesthesia Checklist: Patient identified, Emergency Drugs available, Suction available and Patient being monitored Patient Re-evaluated:Patient Re-evaluated prior to induction Oxygen Delivery Method: Circle System Utilized Preoxygenation: Pre-oxygenation with 100% oxygen Induction Type: IV induction and Rapid sequence Laryngoscope Size: Mac and 3 Grade View: Grade I Tube type: Oral Tube size: 7.0 mm Number of attempts: 1 Airway Equipment and Method: Stylet and Oral airway Placement Confirmation: ETT inserted through vocal cords under direct vision,  positive ETCO2 and breath sounds checked- equal and bilateral Secured at: 21 cm Tube secured with: Tape Dental Injury: Teeth and Oropharynx as per pre-operative assessment

## 2019-01-31 ENCOUNTER — Encounter (HOSPITAL_COMMUNITY): Payer: Self-pay | Admitting: Obstetrics and Gynecology

## 2019-01-31 LAB — CBC
HCT: 34.1 % — ABNORMAL LOW (ref 36.0–46.0)
Hemoglobin: 11.1 g/dL — ABNORMAL LOW (ref 12.0–15.0)
MCH: 29.4 pg (ref 26.0–34.0)
MCHC: 32.6 g/dL (ref 30.0–36.0)
MCV: 90.2 fL (ref 80.0–100.0)
Platelets: 238 10*3/uL (ref 150–400)
RBC: 3.78 MIL/uL — ABNORMAL LOW (ref 3.87–5.11)
RDW: 13.2 % (ref 11.5–15.5)
WBC: 13.1 10*3/uL — ABNORMAL HIGH (ref 4.0–10.5)
nRBC: 0 % (ref 0.0–0.2)

## 2019-01-31 NOTE — Discharge Summary (Signed)
Admission Diagnosis: Pelvic Pain Symptomatic Fibroids Menorrhagia  DIscharge Diagnosis: Same  Hospital Course: Patient underwent uncomplicated TAH, BS.  She did very well post op. By POD # 1 she was ambulating, voiding, and tolerating regular diet.  BP (!) 106/59 (BP Location: Left Arm)   Pulse 63   Temp 98.9 F (37.2 C) (Oral)   Resp 18   Ht 5\' 3"  (1.6 m)   Wt 68.8 kg   LMP 01/17/2019   SpO2 99%   BMI 26.85 kg/m  General alert and oriented Abdomen soft and non tender Incision clean and dry and intact  Results for orders placed or performed during the hospital encounter of 01/30/19 (from the past 24 hour(s))  CBC     Status: Abnormal   Collection Time: 01/31/19  4:10 AM  Result Value Ref Range   WBC 13.1 (H) 4.0 - 10.5 K/uL   RBC 3.78 (L) 3.87 - 5.11 MIL/uL   Hemoglobin 11.1 (L) 12.0 - 15.0 g/dL   HCT 34.1 (L) 36.0 - 46.0 %   MCV 90.2 80.0 - 100.0 fL   MCH 29.4 26.0 - 34.0 pg   MCHC 32.6 30.0 - 36.0 g/dL   RDW 13.2 11.5 - 15.5 %   Platelets 238 150 - 400 K/uL   nRBC 0.0 0.0 - 0.2 %   Patient discharged home in good condition Rx Ibuprofen and Dilaudid Follow up 1 week

## 2021-03-31 ENCOUNTER — Ambulatory Visit (INDEPENDENT_AMBULATORY_CARE_PROVIDER_SITE_OTHER): Payer: Commercial Managed Care - PPO | Admitting: Family Medicine

## 2021-03-31 ENCOUNTER — Encounter: Payer: Self-pay | Admitting: Family Medicine

## 2021-03-31 ENCOUNTER — Other Ambulatory Visit: Payer: Self-pay

## 2021-03-31 DIAGNOSIS — M5442 Lumbago with sciatica, left side: Secondary | ICD-10-CM | POA: Diagnosis not present

## 2021-03-31 MED ORDER — PREDNISONE 10 MG PO TABS: ORAL_TABLET | ORAL | 0 refills | Status: DC

## 2021-03-31 NOTE — Patient Instructions (Signed)
   [employer]:  Absolute Best Exercise for Sciatica & Herniated Disc- McKenzie Approach.  [email or web address]

## 2021-03-31 NOTE — Progress Notes (Signed)
Office Visit Note   Patient: Shari Dalton           Date of Birth: 03-Mar-1971           MRN: [id number] Visit Date: 03/31/2021 Requested by: No referring provider defined for this encounter. PCP: No primary care provider on file.  Subjective: Chief Complaint  Patient presents with  . Lower Back - Pain    Pain started in her back at the end of April and then she started seeing a chiropractor in May - still going once weekly. No known specific incident that caused the pain. Her pain in the back has subsided and is now in the left buttock and down the leg to her foot. Pain in the lower leg. Numbness down the lateral and posterior leg. Prednisone taper helped. Taking ibuprofen prn now.    HPI: She is here at the request of Dr. Imagene Sheller for left-sided low back and leg pain.  Symptoms started toward the end of April.  She recalls lifting her mother-in-law and feeling some pain, then she did some strenuous yard work.  After a while she developed severe pain in the midline lumbosacral spine with radiation down the back of the left leg into the foot.  She began seeing Dr. Imagene Sheller for chiropractic treatments and this helped significantly with her pain.  Then she went to a physician who gave prednisone which also helped.  Her severe pain is much better, but she continues to have a constant numbness and mild to moderate pain down the back of the left leg into the foot along with some weakness in the foot.  She is unable to extend her small toes, and she feels like the ankle will give out if she is not very careful.  Denies any bowel or bladder dysfunction.  She cannot seem to find a position where the pain goes away.  No previous problems with her back like this.  She is otherwise been in good health.              ROS:   All other systems were reviewed and are negative.  Objective: Vital Signs: There were no vitals taken for this visit.  Physical Exam:  General:  Alert and oriented, in no acute  distress. Pulm:  Breathing unlabored. Psy:  Normal mood, congruent affect.  Low back: No scoliosis, equal leg lengths.  She has mild tenderness near the L5-S1 level in the midline.  Moderate tenderness in the left sciatic notch.  Straight leg raise is equivocal, no pain with internal/external hip rotation.  She has weakness with ankle dorsiflexion and eversion on the left, 4/5.  Remainder of left lower extremity strength is normal.  Hypoactive knee and ankle DTRs bilaterally.  Imaging: None today.  She had x-rays per her chiropractor which were unremarkable per her report.    Assessment & Plan: 1.  Left-sided sciatica, concerning for disc protrusion. -She will add McKenzie protocol home exercises to her chiropractic treatments.  Today with prone hyperextension, she was able to achieve modest improvement in symptoms. -1 more prednisone taper.  If in 6 to 8 weeks she is not noticing improvement in strength, she will call me and I will order lumbar MRI scan.     Procedures: No procedures performed        PMFS History: Patient Active Problem List   Diagnosis Date Noted  . Fibroids 01/30/2019  . S/P TAH (total abdominal hysterectomy) 01/30/2019   History reviewed. No pertinent past  medical history.  History reviewed. No pertinent family history.  Past Surgical History:  Procedure Laterality Date  . ANKLE SURGERY Bilateral   . HYSTERECTOMY ABDOMINAL WITH SALPINGECTOMY Bilateral 01/30/2019   Procedure: HYSTERECTOMY ABDOMINAL WITH SALPINGECTOMY;  Surgeon: Dian Queen, MD;  Location: [facility];  Service: Gynecology;  Laterality: Bilateral;  need bed   Social History   Occupational History  . Not on file  Tobacco Use  . Smoking status: Never Smoker  . Smokeless tobacco: Never Used  Vaping Use  . Vaping Use: Never used  Substance and Sexual Activity  . Alcohol use: Never  . Drug use: Never  . Sexual activity: Not on file

## 2021-06-03 ENCOUNTER — Encounter: Payer: Self-pay | Admitting: Gastroenterology

## 2021-07-22 ENCOUNTER — Ambulatory Visit (AMBULATORY_SURGERY_CENTER): Payer: Commercial Managed Care - PPO | Admitting: *Deleted

## 2021-07-22 ENCOUNTER — Other Ambulatory Visit: Payer: Self-pay

## 2021-07-22 ENCOUNTER — Encounter: Payer: Self-pay | Admitting: Gastroenterology

## 2021-07-22 VITALS — Ht 63.0 in | Wt 150.0 lb

## 2021-07-22 DIAGNOSIS — Z800 Family history of malignant neoplasm of digestive organs: Secondary | ICD-10-CM

## 2021-07-22 NOTE — Progress Notes (Signed)
No egg or soy allergy known to patient   issues known to pt with past sedation with any surgeries or procedures- PONV  Patient denies ever being told they had issues or difficulty with intubation  No FH of Malignant Hyperthermia Pt is not on diet pills Pt is not on  home 02  Pt is not on blood thinners  Pt denies issues with constipation - not daily issues but occ  No A fib or A flutter  EMMI video to pt or via MyChart    Pt is fully vaccinated  for Covid   Due to the COVID-19 pandemic we are asking patients to follow certain guidelines.  Pt aware of COVID protocols and [facility] guidelines   Pt verified name, DOB, address and insurance during PV today.  Pt mailed instruction packet of Emmi video, copy of consent form to read and not return, and instructions.  PV completed over the phone.  Pt encouraged to call with questions or issues.  My Chart instructions to pt as well

## 2021-08-05 ENCOUNTER — Encounter: Payer: Self-pay | Admitting: Gastroenterology

## 2021-08-05 ENCOUNTER — Ambulatory Visit (AMBULATORY_SURGERY_CENTER): Payer: Commercial Managed Care - PPO | Admitting: Gastroenterology

## 2021-08-05 ENCOUNTER — Other Ambulatory Visit: Payer: Self-pay

## 2021-08-05 VITALS — BP 127/73 | HR 55 | Temp 98.0°F | Resp 18 | Ht 63.0 in | Wt 150.0 lb

## 2021-08-05 DIAGNOSIS — Z1211 Encounter for screening for malignant neoplasm of colon: Secondary | ICD-10-CM | POA: Diagnosis not present

## 2021-08-05 DIAGNOSIS — Z800 Family history of malignant neoplasm of digestive organs: Secondary | ICD-10-CM | POA: Diagnosis not present

## 2021-08-05 DIAGNOSIS — D123 Benign neoplasm of transverse colon: Secondary | ICD-10-CM

## 2021-08-05 MED ORDER — SODIUM CHLORIDE 0.9 % IV SOLN: 500.0000 mL | Freq: Once | INTRAVENOUS | Status: DC

## 2021-08-05 NOTE — Progress Notes (Signed)
GASTROENTEROLOGY PROCEDURE H&P NOTE   Primary Care Physician: Kerry Dory, NP  HPI: Shari Dalton is a 50 y.o. female who presents for Colonoscopy for screening.  FHx of Father with Colon Cancer.  Past Medical History:  Diagnosis Date   Allergy    Hyperlipidemia    PONV (postoperative nausea and vomiting)    Past Surgical History:  Procedure Laterality Date   ANKLE SURGERY Bilateral    HYSTERECTOMY ABDOMINAL WITH SALPINGECTOMY Bilateral 01/30/2019   Procedure: HYSTERECTOMY ABDOMINAL WITH SALPINGECTOMY;  Surgeon: Dian Queen, MD;  Location: [facility];  Service: Gynecology;  Laterality: Bilateral;  need bed   Current Outpatient Medications  Medication Sig Dispense Refill   rosuvastatin (CRESTOR) 5 MG tablet Take 5 mg by mouth daily.     VITAMIN D PO Take 600 mg by mouth daily.     ibuprofen (ADVIL,MOTRIN) 200 MG tablet Take 400-600 mg by mouth every 8 (eight) hours as needed (pain.).     Current Facility-Administered Medications  Medication Dose Route Frequency Provider Last Rate Last Admin   0.9 %  sodium chloride infusion  500 mL Intravenous Once Mansouraty, Telford Nab., MD        Current Outpatient Medications:    rosuvastatin (CRESTOR) 5 MG tablet, Take 5 mg by mouth daily., Disp: , Rfl:    VITAMIN D PO, Take 600 mg by mouth daily., Disp: , Rfl:    ibuprofen (ADVIL,MOTRIN) 200 MG tablet, Take 400-600 mg by mouth every 8 (eight) hours as needed (pain.)., Disp: , Rfl:   Current Facility-Administered Medications:    0.9 %  sodium chloride infusion, 500 mL, Intravenous, Once, Mansouraty, Telford Nab., MD Allergies  Allergen Reactions   Neosporin [Neomycin-Bacitracin Zn-Polymyx] Other (See Comments)    Wound does not heal   Latex Other (See Comments)    irritation   Family History  Problem Relation Age of Onset   Colon cancer Father    Colon polyps Neg Hx    Esophageal cancer Neg Hx    Stomach cancer Neg Hx    Rectal cancer Neg Hx    Social History    Socioeconomic History   Marital status: Married    Spouse name: Not on file   Number of children: Not on file   Years of education: Not on file   Highest education level: Not on file  Occupational History   Not on file  Tobacco Use   Smoking status: Never   Smokeless tobacco: Never  Vaping Use   Vaping Use: Never used  Substance and Sexual Activity   Alcohol use: Yes    Comment: occ-   Drug use: Never   Sexual activity: Not on file  Other Topics Concern   Not on file  Social History Narrative   Not on file   Social Determinants of Health   Financial Resource Strain: Not on file  Food Insecurity: Not on file  Transportation Needs: Not on file  Physical Activity: Not on file  Stress: Not on file  Social Connections: Not on file  Intimate Partner Violence: Not on file    Physical Exam: Today's Vitals   08/05/21 0809 08/05/21 0811  BP: 128/83   Pulse: 63   Temp: 98 F (36.7 C) 98 F (36.7 C)  TempSrc: Temporal   SpO2: 99%   Weight: 150 lb (68 kg)   Height: 5\' 3"  (1.6 m)    Body mass index is 26.57 kg/m. GEN: NAD EYE: Sclerae anicteric ENT: MMM CV: Non-tachycardic  GI: Soft, NT/ND NEURO:  Alert & Oriented x 3  Lab Results: No results for input(s): WBC, HGB, HCT, PLT in the last 72 hours. BMET No results for input(s): NA, K, CL, CO2, GLUCOSE, BUN, CREATININE, CALCIUM in the last 72 hours. LFT No results for input(s): PROT, ALBUMIN, AST, ALT, ALKPHOS, BILITOT, BILIDIR, IBILI in the last 72 hours. PT/INR No results for input(s): LABPROT, INR in the last 72 hours.   Impression / Plan: This is a 50 y.o.female who presents for Colonoscopy for screening.  FHx of Father with Colon Cancer.  The risks and benefits of endoscopic evaluation/treatment were discussed with the patient and/or family; these include but are not limited to the risk of perforation, infection, bleeding, missed lesions, lack of diagnosis, severe illness requiring hospitalization, as well  as anesthesia and sedation related illnesses.  The patient's history has been reviewed, patient examined, no change in status, and deemed stable for procedure.  The patient and/or family is agreeable to proceed.    Justice Britain, MD [facility] Office # [phone]

## 2021-08-05 NOTE — Patient Instructions (Signed)
Handouts given on diverticulosis, polyps, and hemorrhoids. Await pathology results. Use FiberCon 1-2 tablets daily. High fiber diet.  Repeat colonoscopy in 3 years for surveillance.   YOU HAD AN ENDOSCOPIC PROCEDURE TODAY AT [facility]:   Refer to the procedure report that was given to you for any specific questions about what was found during the examination.  If the procedure report does not answer your questions, please call your gastroenterologist to clarify.  If you requested that your care partner not be given the details of your procedure findings, then the procedure report has been included in a sealed envelope for you to review at your convenience later.  YOU SHOULD EXPECT: Some feelings of bloating in the abdomen. Passage of more gas than usual.  Walking can help get rid of the air that was put into your GI tract during the procedure and reduce the bloating. If you had a lower endoscopy (such as a colonoscopy or flexible sigmoidoscopy) you may notice spotting of blood in your stool or on the toilet paper. If you underwent a bowel prep for your procedure, you may not have a normal bowel movement for a few days.  Please Note:  You might notice some irritation and congestion in your nose or some drainage.  This is from the oxygen used during your procedure.  There is no need for concern and it should clear up in a day or so.  SYMPTOMS TO REPORT IMMEDIATELY:  Following lower endoscopy (colonoscopy or flexible sigmoidoscopy):  Excessive amounts of blood in the stool  Significant tenderness or worsening of abdominal pains  Swelling of the abdomen that is new, acute  Fever of 100F or higher  For urgent or emergent issues, a gastroenterologist can be reached at any hour by calling [phone]. Do not use MyChart messaging for urgent concerns.    DIET:  We do recommend a small meal at first, but then you may proceed to your regular diet.  Drink plenty of fluids but you  should avoid alcoholic beverages for 24 hours.  ACTIVITY:  You should plan to take it easy for the rest of today and you should NOT DRIVE or use heavy machinery until tomorrow (because of the sedation medicines used during the test).    FOLLOW UP: Our staff will call the number listed on your records 48-72 hours following your procedure to check on you and address any questions or concerns that you may have regarding the information given to you following your procedure. If we do not reach you, we will leave a message.  We will attempt to reach you two times.  During this call, we will ask if you have developed any symptoms of COVID 19. If you develop any symptoms (ie: fever, flu-like symptoms, shortness of breath, cough etc.) before then, please call [phone].  If you test positive for Covid 19 in the 2 weeks post procedure, please call and report this information to [place].    If any biopsies were taken you will be contacted by phone or by letter within the next 1-3 weeks.  Please call us at [phone] if you have not heard about the biopsies in 3 weeks.    SIGNATURES/CONFIDENTIALITY: You and/or your care partner have signed paperwork which will be entered into your electronic medical record.  These signatures attest to the fact that that the information above on your After Visit Summary has been reviewed and is understood.  Full responsibility of the confidentiality of this  discharge information lies with you and/or your care-partner.  

## 2021-08-05 NOTE — Progress Notes (Signed)
Called to room to assist during endoscopic procedure.  Patient ID and intended procedure confirmed with present staff. Received instructions for my participation in the procedure from the performing physician.  

## 2021-08-05 NOTE — Progress Notes (Signed)
Report to PACU, RN, vss, BBS= Clear.  

## 2021-08-05 NOTE — Op Note (Signed)
[facility] Patient Name: Shari Dalton Procedure Date: 08/05/2021 9:15 AM MRN: [id number] Endoscopist: Justice Britain , MD Age: 50 Referring MD:  Date of Birth: 05-11-1971 Gender: Female Account #: [id number] Procedure:                Colonoscopy Indications:              Screening patient at increased risk: Family history                            of 1st-degree relative with colorectal cancer at                            age 23 years (or older) Medicines:                Monitored Anesthesia Care Procedure:                Pre-Anesthesia Assessment:                           - Prior to the procedure, a History and Physical                            was performed, and patient medications and                            allergies were reviewed. The patient's tolerance of                            previous anesthesia was also reviewed. The risks                            and benefits of the procedure and the sedation                            options and risks were discussed with the patient.                            All questions were answered, and informed consent                            was obtained. Prior Anticoagulants: The patient has                            taken no previous anticoagulant or antiplatelet                            agents except for NSAID medication. ASA Grade                            Assessment: II - A patient with mild systemic                            disease. After reviewing the risks and benefits,  the patient was deemed in satisfactory condition to                            undergo the procedure.                           After obtaining informed consent, the colonoscope                            was passed under direct vision. Throughout the                            procedure, the patient's blood pressure, pulse, and                            oxygen saturations were monitored continuously. The                             CF [id number] #[id number] was introduced through the anus                            and advanced to the 5 cm into the ileum. The                            colonoscopy was performed without difficulty. The                            patient tolerated the procedure. The quality of the                            bowel preparation was adequate. The terminal ileum,                            ileocecal valve, appendiceal orifice, and rectum                            were photographed. Scope In: 9:18:13 AM Scope Out: 9:39:14 AM Scope Withdrawal Time: 0 hours 15 minutes 6 seconds  Total Procedure Duration: 0 hours 21 minutes 1 second  Findings:                 The digital rectal exam findings include                            hemorrhoids. Pertinent negatives include no                            palpable rectal lesions.                           The colon (entire examined portion) was                            significantly redundant.  A 10 mm polyp was found in the transverse colon.                            The polyp was semi-sessile. The polyp was removed                            with a cold snare. Resection and retrieval were                            complete.                           A few small-mouthed diverticula were found in the                            recto-sigmoid colon and sigmoid colon.                           Normal mucosa was found in the entire colon                            otherwise.                           Non-bleeding non-thrombosed external and internal                            hemorrhoids were found during retroflexion, during                            perianal exam and during digital exam. The                            hemorrhoids were Grade II (internal hemorrhoids                            that prolapse but reduce spontaneously). Complications:            No immediate complications. Estimated Blood  Loss:     Estimated blood loss was minimal. Impression:               - Hemorrhoids found on digital rectal exam.                           - Redundant colon.                           - One 10 mm polyp in the transverse colon, removed                            with a cold snare. Resected and retrieved.                           - Diverticulosis in the recto-sigmoid colon and in  the sigmoid colon.                           - Normal mucosa in the entire examined colon                            otherwise.                           - Non-bleeding non-thrombosed external and internal                            hemorrhoids. Recommendation:           - The patient will be observed post-procedure,                            until all discharge criteria are met.                           - Discharge patient to home.                           - Patient has a contact number available for                            emergencies. The signs and symptoms of potential                            delayed complications were discussed with the                            patient. Return to normal activities tomorrow.                            Written discharge instructions were provided to the                            patient.                           - High fiber diet.                           - Use FiberCon 1-2 tablets PO daily.                           - Continue present medications.                           - Await pathology results.                           - Repeat colonoscopy in 3 years for surveillance.                            Due to family history will consider thereafter  5-year follow ups in future if no further large                            polyps removed.                           - The findings and recommendations were discussed                            with the patient.                           - The findings and  recommendations were discussed                            with the patient's family. Justice Britain, MD 08/05/2021 9:45:33 AM

## 2021-08-05 NOTE — Progress Notes (Signed)
Pt's states no medical or surgical changes since previsit or office visit.  JK IV.

## 2021-08-07 ENCOUNTER — Telehealth: Payer: Self-pay | Admitting: *Deleted

## 2021-08-07 NOTE — Telephone Encounter (Signed)
  Follow up Call-  Call back number 08/05/2021  Post procedure Call Back phone  # [phone]  Permission to leave phone message Yes  Some recent data might be hidden     Patient questions:  Do you have a fever, pain , or abdominal swelling? No. Pain Score  0 *  Have you tolerated food without any problems? Yes.    Have you been able to return to your normal activities? Yes.    Do you have any questions about your discharge instructions: Diet   No. Medications  No. Follow up visit  No.  Do you have questions or concerns about your Care? No.  Actions: * If pain score is 4 or above: No action needed, pain <  Have you developed a fever since your procedure? no  2.   Have you had an respiratory symptoms (SOB or cough) since your procedure? no  3.   Have you tested positive for COVID 19 since your procedure no  4.   Have you had any family members/close contacts diagnosed with the COVID 19 since your procedure?  no   If yes to any of these questions please route to Joylene John, RN and Joella Prince, RN

## 2021-08-12 ENCOUNTER — Encounter: Payer: Self-pay | Admitting: Gastroenterology
